# Patient Record
Sex: Female | Born: 1965 | Race: Black or African American | Hispanic: No | Marital: Single | Smoking: Never smoker
Health system: Southern US, Community
[De-identification: ages and names within clinical notes are randomized; demographics above are authoritative.]

---

## 2012-06-09 ENCOUNTER — Observation Stay: Payer: Self-pay | Admitting: Internal Medicine

## 2012-06-09 LAB — FETAL FIBRONECTIN: Fetal Fibronectin: NEGATIVE

## 2012-07-11 ENCOUNTER — Observation Stay: Payer: Self-pay

## 2012-07-13 LAB — BETA STREP CULTURE(ARMC)

## 2012-07-18 ENCOUNTER — Observation Stay: Payer: Self-pay

## 2012-07-24 ENCOUNTER — Observation Stay: Payer: Self-pay | Admitting: Obstetrics and Gynecology

## 2012-07-27 ENCOUNTER — Inpatient Hospital Stay: Payer: Self-pay | Admitting: Obstetrics and Gynecology

## 2012-07-28 LAB — CBC WITH DIFFERENTIAL/PLATELET
Basophil %: 0.3 %
Eosinophil %: 2 %
HCT: 35.1 % (ref 35.0–47.0)
HGB: 12.1 g/dL (ref 12.0–16.0)
Lymphocyte #: 2.2 10*3/uL (ref 1.0–3.6)
Lymphocyte %: 21.6 %
MCV: 89 fL (ref 80–100)
Monocyte #: 1 x10 3/mm — ABNORMAL HIGH (ref 0.2–0.9)
Monocyte %: 9.6 %
Neutrophil #: 6.7 10*3/uL — ABNORMAL HIGH (ref 1.4–6.5)
Neutrophil %: 66.5 %
RBC: 3.94 10*6/uL (ref 3.80–5.20)
RDW: 14.6 % — ABNORMAL HIGH (ref 11.5–14.5)
WBC: 10.1 10*3/uL (ref 3.6–11.0)

## 2012-10-29 ENCOUNTER — Inpatient Hospital Stay (HOSPITAL_COMMUNITY): Admission: AD | Admit: 2012-10-29 | Payer: Self-pay | Source: Ambulatory Visit | Admitting: Obstetrics & Gynecology

## 2013-03-01 ENCOUNTER — Encounter (HOSPITAL_COMMUNITY): Payer: Self-pay | Admitting: Emergency Medicine

## 2013-03-01 ENCOUNTER — Emergency Department (HOSPITAL_COMMUNITY)
Admission: EM | Admit: 2013-03-01 | Discharge: 2013-03-02 | Disposition: A | Discharge status: Home / Self Care | Payer: Self-pay | Attending: Emergency Medicine | Admitting: Emergency Medicine

## 2013-03-01 DIAGNOSIS — L509 Urticaria, unspecified: Secondary | ICD-10-CM | POA: Insufficient documentation

## 2013-03-01 MED ORDER — FAMOTIDINE 20 MG PO TABS
20.0000 mg | ORAL_TABLET | Freq: Once | ORAL | Status: AC
  Administered 2013-03-01: 20 mg via ORAL
  Filled 2013-03-01: qty 1

## 2013-03-01 MED ORDER — DIPHENHYDRAMINE HCL 25 MG PO CAPS
25.0000 mg | ORAL_CAPSULE | Freq: Once | ORAL | Status: AC
  Administered 2013-03-01: 25 mg via ORAL
  Filled 2013-03-01: qty 1

## 2013-03-01 MED ORDER — PREDNISONE 20 MG PO TABS
60.0000 mg | ORAL_TABLET | Freq: Once | ORAL | Status: AC
  Administered 2013-03-01: 60 mg via ORAL
  Filled 2013-03-01: qty 3
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
## 2013-03-01 NOTE — ED Provider Notes (Signed)
   History    CSN: [id number] Arrival date & time 03/01/13  2257  First MD Initiated Contact with Patient 03/01/13 2312     Chief Complaint  Patient presents with  . Urticaria   (Consider location/radiation/quality/duration/timing/severity/associated sxs/prior Treatment) HPI Comments: Developed generalized hives, waxing and waning through out the day.   Has not taken any medications for symptoms. Denies use of any new products, clothing, foods.  No previous episodes of hives    Patient is a 47 y.o. female presenting with urticaria. The history is provided by the patient.  Urticaria This is a new problem. The current episode started today. The problem occurs constantly. The problem has been waxing and waning. Associated symptoms include a rash. Pertinent negatives include no abdominal pain, chest pain, chills, coughing, fever, headaches, nausea or sore throat. Nothing aggravates the symptoms. She has tried nothing for the symptoms.   History reviewed. No pertinent past medical history. History reviewed. No pertinent past surgical history. No family history on file. History  Substance Use Topics  . Smoking status: Never Smoker   . Smokeless tobacco: Not on file  . Alcohol Use: No   OB History   Grav Para Term Preterm Abortions TAB SAB Ect Mult Living                 Review of Systems  Constitutional: Negative for fever and chills.  HENT: Negative for sore throat.   Respiratory: Negative for cough, shortness of breath and wheezing.   Cardiovascular: Negative for chest pain.  Gastrointestinal: Negative for nausea and abdominal pain.  Skin: Positive for rash.  Neurological: Negative for headaches.  All other systems reviewed and are negative.    Allergies  Review of patient's allergies indicates no known allergies.  Home Medications   Current Outpatient Rx  Name  Route  Sig  Dispense  Refill  . diphenhydrAMINE (BENADRYL) 25 mg capsule   Oral   Take 1 capsule (25 mg  total) by mouth every 6 (six) hours as needed for itching.   30 capsule   0   . famotidine (PEPCID) 20 MG tablet   Oral   Take 1 tablet (20 mg total) by mouth 2 (two) times daily.   10 tablet   0   . predniSONE (DELTASONE) 10 MG tablet   Oral   Take 2 tablets (20 mg total) by mouth daily.   15 tablet   0    BP 123/73  Pulse 61  Temp(Src) 97.7 F (36.5 C) (Oral)  Resp 16  SpO2 98%  LMP 02/10/2013 Physical Exam  Nursing note and vitals reviewed. Constitutional: She is oriented to person, place, and time. She appears well-developed and well-nourished.  HENT:  Head: Normocephalic.  Mouth/Throat: Uvula is midline and oropharynx is clear and moist.  Eyes: Pupils are equal, round, and reactive to light.  Neck: Normal range of motion.  Cardiovascular: Normal rate.   Pulmonary/Chest: Effort normal.  Musculoskeletal: Normal range of motion. She exhibits no edema and no tenderness.  Neurological: She is alert and oriented to person, place, and time.  Skin: Skin is warm and dry. Rash noted.    ED Course  Procedures (including critical care time) Labs Reviewed - No data to display No results found. 1. Urticaria     MDM   Will treat with PO prednisode, Benadry and Pepcid Patient has started to get some relief from the medications   Arman Filter, NP 03/02/13 0125

## 2013-03-02 MED ORDER — PREDNISONE 10 MG PO TABS: 20.0000 mg | ORAL_TABLET | Freq: Every day | ORAL | Status: DC

## 2013-03-02 MED ORDER — DIPHENHYDRAMINE HCL 25 MG PO CAPS: 25.0000 mg | ORAL_CAPSULE | Freq: Four times a day (QID) | ORAL | Status: DC | PRN

## 2013-03-02 MED ORDER — FAMOTIDINE 20 MG PO TABS: 20.0000 mg | ORAL_TABLET | Freq: Two times a day (BID) | ORAL | Status: DC

## 2013-03-02 NOTE — ED Provider Notes (Signed)
Medical screening examination/treatment/procedure(s) were performed by non-physician practitioner and as supervising physician I was immediately available for consultation/collaboration.  Sunnie Nielsen, MD 03/02/13 [phone]

## 2013-03-27 ENCOUNTER — Encounter (HOSPITAL_COMMUNITY): Payer: Self-pay | Admitting: Emergency Medicine

## 2013-03-27 ENCOUNTER — Emergency Department (HOSPITAL_COMMUNITY): Payer: Self-pay

## 2013-03-27 ENCOUNTER — Emergency Department (HOSPITAL_COMMUNITY)
Admission: EM | Admit: 2013-03-27 | Discharge: 2013-03-27 | Disposition: A | Discharge status: Home / Self Care | Payer: Self-pay | Attending: Emergency Medicine | Admitting: Emergency Medicine

## 2013-03-27 DIAGNOSIS — R1012 Left upper quadrant pain: Secondary | ICD-10-CM | POA: Insufficient documentation

## 2013-03-27 DIAGNOSIS — R112 Nausea with vomiting, unspecified: Secondary | ICD-10-CM | POA: Insufficient documentation

## 2013-03-27 DIAGNOSIS — R109 Unspecified abdominal pain: Secondary | ICD-10-CM

## 2013-03-27 LAB — CBC WITH DIFFERENTIAL/PLATELET
Basophils Absolute: 0.1 10*3/uL (ref 0.0–0.1)
HCT: 37.6 % (ref 36.0–46.0)
Lymphocytes Relative: 32 % (ref 12–46)
Lymphs Abs: 1.9 10*3/uL (ref 0.7–4.0)
Monocytes Absolute: 0.4 10*3/uL (ref 0.1–1.0)
Neutro Abs: 3.4 10*3/uL (ref 1.7–7.7)
Platelets: 216 10*3/uL (ref 150–400)
RBC: 4.35 MIL/uL (ref 3.87–5.11)
RDW: 12.9 % (ref 11.5–15.5)
WBC: 6 10*3/uL (ref 4.0–10.5)

## 2013-03-27 LAB — COMPREHENSIVE METABOLIC PANEL
ALT: 21 U/L (ref 0–35)
AST: 21 U/L (ref 0–37)
CO2: 26 mEq/L (ref 19–32)
Chloride: 105 mEq/L (ref 96–112)
GFR calc non Af Amer: >90 mL/min (ref >90)
Sodium: 140 mEq/L (ref 135–145)
Total Bilirubin: 0.3 mg/dL (ref 0.3–1.2)

## 2013-03-27 LAB — URINALYSIS, ROUTINE W REFLEX MICROSCOPIC
Bilirubin Urine: NEGATIVE
Glucose, UA: NEGATIVE mg/dL
Hgb urine dipstick: NEGATIVE
Ketones, ur: NEGATIVE mg/dL
Protein, ur: NEGATIVE mg/dL

## 2013-03-27 MED ORDER — ONDANSETRON HCL 4 MG/2ML IJ SOLN
4.0000 mg | Freq: Once | INTRAMUSCULAR | Status: AC
  Administered 2013-03-27: 4 mg via INTRAVENOUS
  Filled 2013-03-27: qty 2

## 2013-03-27 MED ORDER — MORPHINE SULFATE 2 MG/ML IJ SOLN
2.0000 mg | Freq: Once | INTRAMUSCULAR | Status: AC
  Administered 2013-03-27: 2 mg via INTRAVENOUS
  Filled 2013-03-27: qty 1

## 2013-03-27 MED ORDER — ALUM & MAG HYDROXIDE-SIMETH 200-200-20 MG/5 ML NICU TOPICAL: 1.0000 "application " | TOPICAL | Status: AC | PRN

## 2013-03-27 MED ORDER — GI COCKTAIL ~~LOC~~
30.0000 mL | Freq: Once | ORAL | Status: AC
  Administered 2013-03-27: 30 mL via ORAL
  Filled 2013-03-27: qty 30

## 2013-03-27 NOTE — ED Notes (Signed)
Juice given per the pt request

## 2013-03-27 NOTE — ED Notes (Signed)
Pt speaks [place], per interpretor - Pt c/o LUQ abd pain that started back in november, progressively gotten worse the past couple weeks. sts it feels like cramping, pain worsens with palpitation. Reports n/v approx vomited 5 X, denies diarrhea. Pt denies CP/SOB. LMP 04/11/13. Pt reports she used to take pain medication for this issue but doesn't have them anymore.

## 2013-03-27 NOTE — ED Provider Notes (Signed)
History is obtained from medical interpreter patient speaks no English Complains of left-sided abdominal pain onset yesterday. Patient reports he vomited 5 times today last bowel movement yesterday, normal last normal menstrual period was July 25. No urinary symptoms. She treated herself with Advil. She's been having similar pain since November 2013 she was prescribed pain medicine and a high which he does not recall. On exam patient alert nontoxic lungs clear auscultation heart regular rate and rhythm chest minimally tender over lower ribs abdomen minimally tender at left upper quadrant no guarding rigidity or rebound  Doug Sou, MD 03/28/13 0101

## 2013-03-27 NOTE — ED Notes (Signed)
Patient presents to ED with c/o N/V abdominal pain today.

## 2013-03-27 NOTE — ED Provider Notes (Signed)
CSN: [id number]     Arrival date & time 03/27/13  1648 History     First MD Initiated Contact with Patient 03/27/13 1700     Chief Complaint  Patient presents with  . Abdominal Pain   (Consider location/radiation/quality/duration/timing/severity/associated sxs/prior Treatment) HPI 47 year old female with a significant medical history presents with or left upper quadrant pain beginning yesterday. He's reports that her pain began yesterday waking her from sleep and was associated with nausea and vomiting. She's vomited 5-6 times, nonbloody and. Denied any diarrhea, constipation, blood in her stool. Denies hematemesis. Describes a left upper quadrant pain as cramping it comes and goes. Denies dysuria, vaginal bleeding, vaginal discharge. Has been taking Advil without relief.  Additional history from Dr. Ethelda Chick reveals the patient has had similar pain since November of last year and was given a medication in [place].  History reviewed. No pertinent past medical history. History reviewed. No pertinent past surgical history. History reviewed. No pertinent family history. History  Substance Use Topics  . Smoking status: Never Smoker   . Smokeless tobacco: Not on file  . Alcohol Use: No   OB History   Grav Para Term Preterm Abortions TAB SAB Ect Mult Living                 Review of Systems  Constitutional: Negative for fever.  HENT: Negative for sore throat and neck pain.   Eyes: Negative for visual disturbance.  Respiratory: Negative for cough and shortness of breath.   Cardiovascular: Negative for chest pain.  Gastrointestinal: Positive for nausea, vomiting and abdominal pain. Negative for diarrhea and blood in stool.  Genitourinary: Negative for vaginal bleeding, vaginal discharge, difficulty urinating and pelvic pain.  Musculoskeletal: Negative for back pain.  Skin: Negative for rash.  Neurological: Negative for syncope and headaches.    Allergies  Review of patient's  allergies indicates no known allergies.  Home Medications   Current Outpatient Rx  Name  Route  Sig  Dispense  Refill  . ibuprofen (ADVIL,MOTRIN) 200 MG tablet   Oral   Take 400 mg by mouth every 6 (six) hours as needed for pain.         Marland Kitchen alum & mag hydroxide-simeth (MAALOX/MYLANTA) 200-200-20 MG/5 SUSP   Topical   Apply 1 application topically as needed.   150 mL   0    BP 124/83  Pulse 56  Temp(Src) 98 F (36.7 C) (Oral)  Resp 24  SpO2 100%  LMP 03/11/2013 Physical Exam  Nursing note and vitals reviewed. Constitutional: She is oriented to person, place, and time. She appears well-developed and well-nourished. No distress.  HENT:  Head: Normocephalic and atraumatic.  Mouth/Throat: No oropharyngeal exudate.  Eyes: Conjunctivae and EOM are normal.  Neck: Normal range of motion.  Cardiovascular: Normal rate, regular rhythm, normal heart sounds and intact distal pulses.  Exam reveals no gallop and no friction rub.   No murmur heard. Pulmonary/Chest: Effort normal and breath sounds normal. No respiratory distress. She has no wheezes. She has no rales.  Abdominal: Soft. She exhibits no distension. There is tenderness (LUQ). There is no guarding.  Musculoskeletal: She exhibits no edema and no tenderness.  Neurological: She is alert and oriented to person, place, and time.  Skin: Skin is warm and dry. No rash noted. She is not diaphoretic. No erythema.    ED Course   Procedures (including critical care time)  Labs Reviewed  URINE CULTURE  CBC WITH DIFFERENTIAL  COMPREHENSIVE METABOLIC PANEL  LIPASE, BLOOD  URINALYSIS, ROUTINE W REFLEX MICROSCOPIC   Dg Chest 2 View  03/27/2013   *RADIOLOGY REPORT*  Clinical Data: Chest pain, abdominal pain  CHEST - 2 VIEW  Comparison: None.  Findings: Cardiomediastinal silhouette is within normal limits. The lungs are clear. No pleural effusion.  No pneumothorax.  No acute osseous abnormality.  IMPRESSION: Normal chest.   Original  Report Authenticated By: Christiana Pellant, M.D.   1. Abdominal pain     Date: 03/28/2013  Rate: 67  Rhythm: normal sinus rhythm  QRS Axis: normal  Intervals: normal  ST/T Wave abnormalities: normal  Conduction Disutrbances:none  Narrative Interpretation:   Old EKG Reviewed: none available   MDM  47 year old female with a significant medical history presents with or left upper quadrant pain beginning yesterday. Differential diagnosis includes pancreatitis, gastritis, hepatitis, cholecystitis, less likely diverticulitis or pelvic pathology given location of pain.  Urinalysis shows no signs of urinary tract infection. Quality and location of pain not consistent with nephrolithiasis. CBC does not show an inflammatory response to suggest infection, hemoglobin within normal limits. Lipase within normal limits. Chest x-ray performed. Patient with tenderness located over her left lower rib and was within normal limits without signs of fracture. EKG also obtained given location over this area and was within normal limits and have low suspicion for cardiac etiology pain given left upper quadrant tenderness. Patient with likely GERD, PUD or gastritis and was given a GI cocktail with relief of symptoms.  Patient requesting prescription for similar at time of discharge and was given a prescription for Maalox and a number to followup with the wellness care to find a primary care physician for followup.  Patient discharged in stable condition with understanding of reasons to return.   Rhae Lerner, MD 03/28/13 1610  Rhae Lerner, MD 03/28/13 [phone]

## 2013-03-27 NOTE — ED Notes (Signed)
The pt appears comfortable.  She is requesting oral fluids

## 2013-03-29 LAB — URINE CULTURE

## 2013-03-29 NOTE — ED Provider Notes (Signed)
I have personally seen and examined the patient.  I have discussed the plan of care with the resident.  I have reviewed the documentation on PMH/FH/Soc. History.  I have reviewed the documentation of the resident and agree.  Doug Sou, MD 03/29/13 [phone]

## 2013-12-26 ENCOUNTER — Ambulatory Visit: Payer: Self-pay

## 2014-01-25 ENCOUNTER — Ambulatory Visit: Payer: Self-pay
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
## 2015-06-15 LAB — GLUCOSE, POCT (MANUAL RESULT ENTRY): POC GLUCOSE: 69 mg/dL — AB (ref 70–99)

## 2015-06-20 ENCOUNTER — Ambulatory Visit: Payer: Self-pay | Attending: Family Medicine

## 2016-07-25 LAB — GLUCOSE, POCT (MANUAL RESULT ENTRY): POC GLUCOSE: 83 mg/dL (ref 70–99)

## 2016-08-17 ENCOUNTER — Emergency Department (HOSPITAL_COMMUNITY)
Admission: EM | Admit: 2016-08-17 | Discharge: 2016-08-17 | Disposition: A | Discharge status: Home / Self Care | Payer: Self-pay | Attending: Emergency Medicine | Admitting: Emergency Medicine

## 2016-08-17 ENCOUNTER — Emergency Department (HOSPITAL_COMMUNITY): Payer: Self-pay

## 2016-08-17 ENCOUNTER — Encounter (HOSPITAL_COMMUNITY): Payer: Self-pay | Admitting: Emergency Medicine

## 2016-08-17 DIAGNOSIS — Z79899 Other long term (current) drug therapy: Secondary | ICD-10-CM | POA: Insufficient documentation

## 2016-08-17 DIAGNOSIS — Y929 Unspecified place or not applicable: Secondary | ICD-10-CM | POA: Insufficient documentation

## 2016-08-17 DIAGNOSIS — T148XXA Other injury of unspecified body region, initial encounter: Secondary | ICD-10-CM

## 2016-08-17 DIAGNOSIS — S39012A Strain of muscle, fascia and tendon of lower back, initial encounter: Secondary | ICD-10-CM | POA: Insufficient documentation

## 2016-08-17 DIAGNOSIS — Y939 Activity, unspecified: Secondary | ICD-10-CM | POA: Insufficient documentation

## 2016-08-17 DIAGNOSIS — Y999 Unspecified external cause status: Secondary | ICD-10-CM | POA: Insufficient documentation

## 2016-08-17 DIAGNOSIS — X58XXXA Exposure to other specified factors, initial encounter: Secondary | ICD-10-CM | POA: Insufficient documentation

## 2016-08-17 LAB — URINALYSIS, ROUTINE W REFLEX MICROSCOPIC
BILIRUBIN URINE: NEGATIVE
GLUCOSE, UA: NEGATIVE mg/dL
Hgb urine dipstick: NEGATIVE
KETONES UR: NEGATIVE mg/dL
LEUKOCYTES UA: NEGATIVE
Nitrite: NEGATIVE
Protein, ur: NEGATIVE mg/dL
Specific Gravity, Urine: 1.005 (ref 1.005–1.030)
pH: 7 (ref 5.0–8.0)

## 2016-08-17 MED ORDER — ACETAMINOPHEN 500 MG PO TABS
1000.0000 mg | ORAL_TABLET | Freq: Once | ORAL | Status: AC
  Administered 2016-08-17: 1000 mg via ORAL
  Filled 2016-08-17: qty 2

## 2016-08-17 MED ORDER — IBUPROFEN 800 MG PO TABS
800.0000 mg | ORAL_TABLET | Freq: Once | ORAL | Status: AC
  Administered 2016-08-17: 800 mg via ORAL
  Filled 2016-08-17: qty 1

## 2016-08-17 NOTE — ED Provider Notes (Signed)
[facility]-EMERGENCY DEPT Provider Note   CSN: [id number] Arrival date & time: 08/17/16  0359     History   Chief Complaint Chief Complaint  Patient presents with  . Back Pain    HPI Stephania FragminHadiza Whitner is a 50 y.o. female.  50 yo F with a chief complaint of left-sided back pain. She describes this as a pain that starts in her back and radiates around to the front. Describes this as a severe pain worse with certain positions and palpation. Denies worse with movement or twisting. Denies dysuria denies fevers denies vomiting. Denies abdominal pain. Denies injury.   The history is provided by the patient.  Back Pain   This is a new problem. The current episode started less than 1 hour ago. The problem occurs constantly. The problem has not changed since onset.The pain is associated with no known injury. The pain is present in the thoracic spine. The quality of the pain is described as shooting. Radiates to: around the left rib margin to the front. The pain is at a severity of 8/10. The pain is moderate. Exacerbated by: palpation. Pertinent negatives include no chest pain, no fever, no headaches and no dysuria. She has tried nothing for the symptoms. The treatment provided no relief.    History reviewed. No pertinent past medical history.  There are no active problems to display for this patient.   History reviewed. No pertinent surgical history.  OB History    No data available       Home Medications    Prior to Admission medications   Medication Sig Start Date End Date Taking? Authorizing Provider  alum & mag hydroxide-simeth (MAALOX/MYLANTA) 200-200-20 MG/5 SUSP Apply 1 application topically as needed. 03/27/13   Alvira MondayErin Schlossman, MD  ibuprofen (ADVIL,MOTRIN) 200 MG tablet Take 400 mg by mouth every 6 (six) hours as needed for pain.    Historical Provider, MD    Family History No family history on file.  Social History Social History  Substance Use Topics  . Smoking status:  Never Smoker  . Smokeless tobacco: Never Used  . Alcohol use No     Allergies   Patient has no known allergies.   Review of Systems Review of Systems  Constitutional: Negative for chills and fever.  HENT: Negative for congestion and rhinorrhea.   Eyes: Negative for redness and visual disturbance.  Respiratory: Negative for shortness of breath and wheezing.   Cardiovascular: Negative for chest pain and palpitations.  Gastrointestinal: Negative for nausea and vomiting.  Genitourinary: Negative for dysuria and urgency.  Musculoskeletal: Positive for back pain. Negative for arthralgias and myalgias.  Skin: Negative for pallor and wound.  Neurological: Negative for dizziness and headaches.     Physical Exam Updated Vital Signs BP 117/83 (BP Location: Left Arm)   Pulse 62   Temp 97.8 F (36.6 C) (Oral)   Resp 14   Ht 5\' 2"  (1.575 m)   Wt 167 lb 9 oz (76 kg)   LMP 07/09/2016 (Approximate)   SpO2 100%   BMI 30.65 kg/m   Physical Exam  Constitutional: She is oriented to person, place, and time. She appears well-developed and well-nourished. No distress.  HENT:  Head: Normocephalic and atraumatic.  Eyes: EOM are normal. Pupils are equal, round, and reactive to light.  Neck: Normal range of motion. Neck supple.  Cardiovascular: Normal rate and regular rhythm.  Exam reveals no gallop and no friction rub.   No murmur heard. Pulmonary/Chest: Effort normal. She has no  wheezes. She has no rales.  Abdominal: Soft. She exhibits no distension and no mass. There is no tenderness. There is no guarding.  Musculoskeletal: She exhibits tenderness (TTP about the L CVA). She exhibits no edema.  Neurological: She is alert and oriented to person, place, and time.  Skin: Skin is warm and dry. She is not diaphoretic.  Psychiatric: She has a normal mood and affect. Her behavior is normal.  Nursing note and vitals reviewed.    ED Treatments / Results  Labs (all labs ordered are listed, but  only abnormal results are displayed) Labs Reviewed  URINALYSIS, ROUTINE W REFLEX MICROSCOPIC    EKG  EKG Interpretation None       Radiology No results found.  Procedures Procedures (including critical care time)  Medications Ordered in ED Medications  acetaminophen (TYLENOL) tablet 1,000 mg (not administered)  ibuprofen (ADVIL,MOTRIN) tablet 800 mg (not administered)     Initial Impression / Assessment and Plan / ED Course  I have reviewed the triage vital signs and the nursing notes.  Pertinent labs & imaging results that were available during my care of the patient were reviewed by me and considered in my medical decision making (see chart for details).  Clinical Course     50 yo F With a chief complaints of left-sided thoracic back pain. She described this in a pattern that radiates into the friend I do not see a rash though in the distribution she describes shingles should be in the differential. 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 Will obtain a UA.  CXR negative. UA negative, d/c home.     I have discussed the diagnosis/risks/treatment options with the patient and family and believe the pt to be eligible for discharge home to follow-up with PCP. We also discussed returning to the ED immediately if new or worsening sx occur. We discussed the sx which are most concerning (e.g., sudden worsening pain, fever, inability to tolerate by mouth) that necessitate immediate return. Medications administered to the patient during their visit and any new prescriptions provided to the patient are listed below.  Medications given during this visit Medications  acetaminophen (TYLENOL) tablet 1,000 mg (1,000 mg Oral Given 08/17/16 0543)  ibuprofen (ADVIL,MOTRIN) tablet 800 mg (800 mg Oral Given 08/17/16 0543)     The patient appears reasonably screen and/or stabilized for discharge and I doubt any other medical condition or other [facility] requiring further screening,  evaluation, or treatment in the ED at this time prior to discharge.    Final Clinical Impressions(s) / ED Diagnoses   Final diagnoses:  None    New Prescriptions New Prescriptions   No medications on file     Melene PlanDan Iseah Plouff, DO 08/17/16 2325

## 2016-08-17 NOTE — ED Notes (Signed)
Patient transported to X-ray 

## 2016-08-17 NOTE — Discharge Instructions (Signed)
Take 4 over the counter ibuprofen tablets 3 times a day or 2 over-the-counter naproxen tablets twice a day for pain. Also take tylenol 1000mg(2 extra strength) four times a day.    

## 2016-08-17 NOTE — ED Triage Notes (Signed)
Patient reports mid back pain radiating to left lateral ribcage worse with movement and changing positions onset 1 week  , denies injury or fall / respirations unlabored .

## 2016-09-30 ENCOUNTER — Ambulatory Visit: Payer: Self-pay | Attending: Family Medicine | Admitting: Family Medicine

## 2016-09-30 ENCOUNTER — Encounter: Payer: Self-pay | Admitting: Family Medicine

## 2016-09-30 VITALS — BP 119/72 | HR 68 | Temp 98.2°F | Resp 18 | Ht 65.0 in | Wt 163.2 lb

## 2016-09-30 DIAGNOSIS — Z0000 Encounter for general adult medical examination without abnormal findings: Secondary | ICD-10-CM

## 2016-09-30 DIAGNOSIS — Z0001 Encounter for general adult medical examination with abnormal findings: Secondary | ICD-10-CM | POA: Insufficient documentation

## 2016-09-30 DIAGNOSIS — Z23 Encounter for immunization: Secondary | ICD-10-CM

## 2016-09-30 DIAGNOSIS — N926 Irregular menstruation, unspecified: Secondary | ICD-10-CM | POA: Insufficient documentation

## 2016-09-30 DIAGNOSIS — H11002 Unspecified pterygium of left eye: Secondary | ICD-10-CM | POA: Insufficient documentation

## 2016-09-30 LAB — BASIC METABOLIC PANEL WITH GFR
BUN: 9 mg/dL (ref 7–25)
CO2: 27 mmol/L (ref 20–31)
CREATININE: 0.84 mg/dL (ref 0.50–1.10)
Calcium: 9.3 mg/dL (ref 8.6–10.2)
Chloride: 105 mmol/L (ref 98–110)
GFR, EST NON AFRICAN AMERICAN: 82 mL/min (ref >=60)
GFR, Est African American: >89 mL/min (ref >=60)
Glucose, Bld: 82 mg/dL (ref 65–99)
Potassium: 4 mmol/L (ref 3.5–5.3)
SODIUM: 141 mmol/L (ref 135–146)

## 2016-09-30 LAB — LIPID PANEL
Cholesterol: 202 mg/dL — ABNORMAL HIGH (ref <200)
HDL: 73 mg/dL (ref >50)
LDL Cholesterol: 115 mg/dL — ABNORMAL HIGH (ref <100)
Total CHOL/HDL Ratio: 2.8 Ratio (ref <5.0)
Triglycerides: 72 mg/dL (ref <150)
VLDL: 14 mg/dL (ref <30)

## 2016-09-30 LAB — CBC WITH DIFFERENTIAL/PLATELET
Basophils Absolute: 41 cells/uL (ref 0–200)
Basophils Relative: 1 %
Eosinophils Absolute: 164 cells/uL (ref 15–500)
Eosinophils Relative: 4 %
HEMATOCRIT: 36.2 % (ref 35.0–45.0)
Hemoglobin: 12.1 g/dL (ref 11.7–15.5)
LYMPHS PCT: 36 %
Lymphs Abs: 1476 cells/uL (ref 850–3900)
MCH: 29.6 pg (ref 27.0–33.0)
MCHC: 33.4 g/dL (ref 32.0–36.0)
MCV: 88.5 fL (ref 80.0–100.0)
MONO ABS: 410 {cells}/uL (ref 200–950)
MONOS PCT: 10 %
MPV: 10.3 fL (ref 7.5–12.5)
NEUTROS ABS: 2009 {cells}/uL (ref 1500–7800)
Neutrophils Relative %: 49 %
PLATELETS: 197 10*3/uL (ref 140–400)
RBC: 4.09 MIL/uL (ref 3.80–5.10)
RDW: 14.3 % (ref 11.0–15.0)
WBC: 4.1 10*3/uL (ref 3.8–10.8)

## 2016-09-30 LAB — POCT URINE PREGNANCY: PREG TEST UR: NEGATIVE

## 2016-09-30 LAB — HEPATIC FUNCTION PANEL
ALT: 17 U/L (ref 6–29)
AST: 22 U/L (ref 10–35)
Albumin: 4.2 g/dL (ref 3.6–5.1)
Alkaline Phosphatase: 80 U/L (ref 33–115)
BILIRUBIN DIRECT: 0.1 mg/dL (ref <=0.2)
BILIRUBIN TOTAL: 0.4 mg/dL (ref 0.2–1.2)
Indirect Bilirubin: 0.3 mg/dL (ref 0.2–1.2)
Total Protein: 7.7 g/dL (ref 6.1–8.1)

## 2016-09-30 NOTE — Progress Notes (Signed)
Patient is here for a physical  Patient has not eaten today   Patient has not taking any meds today  Patient denies pain today

## 2016-09-30 NOTE — Patient Instructions (Signed)
Menopause Menopause is the normal time of life when menstrual periods stop completely. Menopause is complete when you have missed 12 consecutive menstrual periods. It usually occurs between the ages of 48 years and 55 years. Very rarely does a woman develop menopause before the age of 40 years. At menopause, your ovaries stop producing the female hormones estrogen and progesterone. This can cause undesirable symptoms and also affect your health. Sometimes the symptoms may occur 4-5 years before the menopause begins. There is no relationship between menopause and:  Oral contraceptives.  Number of children you had.  Race.  The age your menstrual periods started (menarche).  Heavy smokers and very thin women may develop menopause earlier in life. What are the causes?  The ovaries stop producing the female hormones estrogen and progesterone. Other causes include:  Surgery to remove both ovaries.  The ovaries stop functioning for no known reason.  Tumors of the pituitary gland in the brain.  Medical disease that affects the ovaries and hormone production.  Radiation treatment to the abdomen or pelvis.  Chemotherapy that affects the ovaries.  What are the signs or symptoms?  Hot flashes.  Night sweats.  Decrease in sex drive.  Vaginal dryness and thinning of the vagina causing painful intercourse.  Dryness of the skin and developing wrinkles.  Headaches.  Tiredness.  Irritability.  Memory problems.  Weight gain.  Bladder infections.  Hair growth of the face and chest.  Infertility. More serious symptoms include:  Loss of bone (osteoporosis) causing breaks (fractures).  Depression.  Hardening and narrowing of the arteries (atherosclerosis) causing heart attacks and strokes.  How is this diagnosed?  When the menstrual periods have stopped for 12 straight months.  Physical exam.  Hormone studies of the blood. How is this treated? There are many treatment  choices and nearly as many questions about them. The decisions to treat or not to treat menopausal changes is an individual choice made with your health care provider. Your health care provider can discuss the treatments with you. Together, you can decide which treatment will work best for you. Your treatment choices may include:  Hormone therapy (estrogen and progesterone).  Non-hormonal medicines.  Treating the individual symptoms with medicine (for example antidepressants for depression).  Herbal medicines that may help specific symptoms.  Counseling by a psychiatrist or psychologist.  Group therapy.  Lifestyle changes including: ? Eating healthy. ? Regular exercise. ? Limiting caffeine and alcohol. ? Stress management and meditation.  No treatment.  Follow these instructions at home:  Take the medicine your health care provider gives you as directed.  Get plenty of sleep and rest.  Exercise regularly.  Eat a diet that contains calcium (good for the bones) and soy products (acts like estrogen hormone).  Avoid alcoholic beverages.  Do not smoke.  If you have hot flashes, dress in layers.  Take supplements, calcium, and vitamin D to strengthen bones.  You can use over-the-counter lubricants or moisturizers for vaginal dryness.  Group therapy is sometimes very helpful.  Acupuncture may be helpful in some cases. Contact a health care provider if:  You are not sure you are in menopause.  You are having menopausal symptoms and need advice and treatment.  You are still having menstrual periods after age 55 years.  You have pain with intercourse.  Menopause is complete (no menstrual period for 12 months) and you develop vaginal bleeding.  You need a referral to a specialist (gynecologist, psychiatrist, or psychologist) for treatment. Get help right   away if:  You have severe depression.  You have excessive vaginal bleeding.  You fell and think you have a  broken bone.  You have pain when you urinate.  You develop leg or chest pain.  You have a fast pounding heart beat (palpitations).  You have severe headaches.  You develop vision problems.  You feel a lump in your breast.  You have abdominal pain or severe indigestion. This information is not intended to replace advice given to you by your health care provider. Make sure you discuss any questions you have with your health care provider. Document Released: 10/25/2003 Document Revised: 01/10/2016 Document Reviewed: 03/03/2013 Elsevier Interactive Patient Education  2017 Elsevier Inc[employer]  

## 2016-09-30 NOTE — Progress Notes (Signed)
Subjective:   Patient ID: Selena Conley, female    DOB: 26-Aug-1965, 51 y.o.   MRN: [id number]  Chief Complaint  Patient presents with  . Establish Care   HPI  Selena Conley 51 y.o. female presents For annual physical exam. She is a nonsmoker. She denies any family history of hypertension, diabetes, or cancer. She does have concerns about a miss menstrual period. Reports last menstrual period was in December 2017. Denies symptoms of all other pertinent systems.   No past medical history on file.  No past surgical history on file.  No family history on file.  Social History   Social History  . Marital status: Single    Spouse name: N/A  . Number of children: N/A  . Years of education: N/A   Occupational History  . Not on file.   Social History Main Topics  . Smoking status: Never Smoker  . Smokeless tobacco: Never Used  . Alcohol use No  . Drug use: No  . Sexual activity: Not on file   Other Topics Concern  . Not on file   Social History Narrative  . No narrative on file    Outpatient Medications Prior to Visit  Medication Sig Dispense Refill  . alum & mag hydroxide-simeth (MAALOX/MYLANTA) 200-200-20 MG/5 SUSP Apply 1 application topically as needed. (Patient not taking: Reported on 08/17/2016) 150 mL 0   No facility-administered medications prior to visit.     No Known Allergies  Review of Systems  Constitutional: Negative.   HENT: Negative.   Eyes: Negative.   Respiratory: Negative.   Cardiovascular: Negative.   Gastrointestinal: Negative.   Genitourinary:       Menstrual problem.  Musculoskeletal: Negative.   Skin: Negative.   Neurological: Negative.   Endo/Heme/Allergies: Negative.   Psychiatric/Behavioral: Negative.        Objective:    Physical Exam  Constitutional: She is oriented to person, place, and time. She appears well-developed and well-nourished.  HENT:  Head: Normocephalic and atraumatic.  Right Ear: External ear normal.  Left  Ear: External ear normal.  Nose: Nose normal.  Mouth/Throat: Oropharynx is clear and moist.  Eyes: Conjunctivae and EOM are normal. Pupils are equal, round, and reactive to light.    Neck: Normal range of motion. Neck supple.  Cardiovascular: Normal rate, regular rhythm, normal heart sounds and intact distal pulses.   Pulmonary/Chest: Effort normal and breath sounds normal.  Abdominal: Soft. Bowel sounds are normal.  Genitourinary: Vagina normal and uterus normal.  Musculoskeletal: Normal range of motion.  Neurological: She is alert and oriented to person, place, and time. She has normal reflexes.  Skin: Skin is warm and dry.  Psychiatric: She has a normal mood and affect. Her behavior is normal. Thought content normal.  Nursing note and vitals reviewed.   BP 119/72 (BP Location: Left Arm, Patient Position: Sitting, Cuff Size: Normal)   Pulse 68   Temp 98.2 F (36.8 C) (Oral)   Resp 18   Ht 5\' 5"  (1.651 m)   Wt 163 lb 3.2 oz (74 kg)   LMP 07/07/2016   SpO2 94%   BMI 27.16 kg/m  Wt Readings from Last 3 Encounters:  09/30/16 163 lb 3.2 oz (74 kg)  08/17/16 167 lb 9 oz (76 kg)  06/15/15 166 lb (75.3 kg)    Immunization History  Administered Date(s) Administered  . Influenza,inj,Quad PF,36+ Mos 09/30/2016  . Tdap 09/30/2016    Diabetic Foot Exam - Simple   No data filed  Lab Results  Component Value Date   TSH 0.84 09/30/2016   Lab Results  Component Value Date   WBC 4.1 09/30/2016   HGB 12.1 09/30/2016   HCT 36.2 09/30/2016   MCV 88.5 09/30/2016   PLT 197 09/30/2016   Lab Results  Component Value Date   NA 141 09/30/2016   K 4.0 09/30/2016   CO2 27 09/30/2016   GLUCOSE 82 09/30/2016   BUN 9 09/30/2016   CREATININE 0.84 09/30/2016   BILITOT 0.4 09/30/2016   ALKPHOS 80 09/30/2016   AST 22 09/30/2016   ALT 17 09/30/2016   PROT 7.7 09/30/2016   ALBUMIN 4.2 09/30/2016   CALCIUM 9.3 09/30/2016   Lab Results  Component Value Date   CHOL 202 (H)  09/30/2016   Lab Results  Component Value Date   HDL 73 09/30/2016   Lab Results  Component Value Date   LDLCALC 115 (H) 09/30/2016   Lab Results  Component Value Date   TRIG 72 09/30/2016   Lab Results  Component Value Date   CHOLHDL 2.8 09/30/2016   Lab Results  Component Value Date   HGBA1C 5.8 (H) 09/30/2016       Assessment & Plan:   Problem List Items Addressed This Visit    None    Visit Diagnoses    Annual physical exam    -  Primary   Relevant Orders   Ambulatory referral to Dentistry   HIV antibody (Completed)   BASIC METABOLIC PANEL WITH GFR (Completed)   CBC with Differential (Completed)   Vitamin D, 25-hydroxy (Completed)   TSH (Completed)   Hemoglobin A1c (Completed)   Lipid Panel (Completed)   Hepatic Function Panel (Completed)   Irregular menstrual cycle       Relevant Orders   POCT urine pregnancy (Completed)   FSH/LH (Completed)   Healthcare maintenance       Relevant Orders   MM Digital Screening   Pterygium of left eye       Relevant Orders   Ambulatory referral to Ophthalmology   Needs flu shot       Relevant Orders   Flu Vaccine QUAD 36+ mos PF IM (Fluarix & Fluzone Quad PF) (Completed)        Selena SenateMandesia Hairston, FNP

## 2016-10-01 LAB — FSH/LH
FSH: 138.9 m[IU]/mL — ABNORMAL HIGH
LH: 57 m[IU]/mL

## 2016-10-01 LAB — HEMOGLOBIN A1C
Hgb A1c MFr Bld: 5.8 % — ABNORMAL HIGH (ref <5.7)
Mean Plasma Glucose: 120 mg/dL

## 2016-10-01 LAB — VITAMIN D 25 HYDROXY (VIT D DEFICIENCY, FRACTURES): Vit D, 25-Hydroxy: 19 ng/mL — ABNORMAL LOW (ref 30–100)

## 2016-10-01 LAB — HIV ANTIBODY (ROUTINE TESTING W REFLEX): HIV: NONREACTIVE

## 2016-10-01 LAB — TSH: TSH: 0.84 m[IU]/L

## 2016-10-07 ENCOUNTER — Other Ambulatory Visit: Payer: Self-pay | Admitting: Family Medicine

## 2016-10-07 DIAGNOSIS — R7303 Prediabetes: Secondary | ICD-10-CM

## 2016-10-07 DIAGNOSIS — E559 Vitamin D deficiency, unspecified: Secondary | ICD-10-CM

## 2016-10-07 MED ORDER — VITAMIN D (ERGOCALCIFEROL) 1.25 MG (50000 UNIT) PO CAPS: 50000.0000 [IU] | ORAL_CAPSULE | ORAL | 0 refills | Status: AC

## 2016-10-09 ENCOUNTER — Telehealth: Payer: Self-pay

## 2016-10-09 NOTE — Telephone Encounter (Signed)
-----   Message from Lizbeth BarkMandesia R Hairston, FNP sent at 10/07/2016  6:36 PM EST ----- HIV negative Vitamin D level was low. Vitamin D helps to keep bones strong. You were prescribed ergocalciferol (capsules) to increase your vitamin-d level. Once finished start taking OTC vitamin d supplement with 800 international units (I) of vitamin-d per day.  HgbA1c is  5.8 which is considered pre-diabetes. HgbA1c gives an average of your blood sugar levels over the last 3 months. HgbA1c levels of 6.5 or greater indicates diabetes. Avoid eating large amounts of starches, white bread, rice, and sugar. Start exercising 3 to 5 time per week. Schedule lab appointment to recheck HgbA1c levels in 3 months. FSH/LH look at your ovary function and ovulation. You levels indicate you are perimenopausal. No menstrual cycle for 12 consecutive months indicates menopause. Kidney function normal Liver function normal Labs normal

## 2016-10-14 NOTE — Telephone Encounter (Signed)
-----   Message from Mandesia R Hairston, FNP sent at 10/07/2016  6:36 PM EST ----- HIV negative Vitamin D level was low. Vitamin D helps to keep bones strong. You were prescribed ergocalciferol (capsules) to increase your vitamin-d level. Once finished start taking OTC vitamin d supplement with 800 international units (I) of vitamin-d per day.  HgbA1c is  5.8 which is considered pre-diabetes. HgbA1c gives an average of your blood sugar levels over the last 3 months. HgbA1c levels of 6.5 or greater indicates diabetes. Avoid eating large amounts of starches, white bread, rice, and sugar. Start exercising 3 to 5 time per week. Schedule lab appointment to recheck HgbA1c levels in 3 months. FSH/LH look at your ovary function and ovulation. You levels indicate you are perimenopausal. No menstrual cycle for 12 consecutive months indicates menopause. Kidney function normal Liver function normal Labs normal  

## 2016-10-14 NOTE — Telephone Encounter (Signed)
Interpreter name Silvestre Mesicristina  Interpreter ID #  [id number]  CMA call to go over lab results   Patient did not answer, interpreter left a VM stating the reason of the call

## 2018-01-26 ENCOUNTER — Emergency Department (HOSPITAL_COMMUNITY)
Admission: EM | Admit: 2018-01-26 | Discharge: 2018-01-26 | Disposition: A | Discharge status: Home / Self Care | Payer: Self-pay | Attending: Emergency Medicine | Admitting: Emergency Medicine

## 2018-01-26 DIAGNOSIS — K029 Dental caries, unspecified: Secondary | ICD-10-CM | POA: Insufficient documentation

## 2018-01-26 DIAGNOSIS — K0889 Other specified disorders of teeth and supporting structures: Secondary | ICD-10-CM

## 2018-01-26 MED ORDER — PENICILLIN V POTASSIUM 500 MG PO TABS: 500.0000 mg | ORAL_TABLET | Freq: Four times a day (QID) | ORAL | 0 refills | Status: AC

## 2018-01-26 MED ORDER — OXYCODONE-ACETAMINOPHEN 5-325 MG PO TABS
1.0000 | ORAL_TABLET | Freq: Once | ORAL | Status: AC
  Administered 2018-01-26: 1 via ORAL
  Filled 2018-01-26: qty 1

## 2018-01-26 MED ORDER — TRAMADOL HCL 50 MG PO TABS: 50.0000 mg | ORAL_TABLET | Freq: Four times a day (QID) | ORAL | 0 refills | Status: AC | PRN

## 2018-01-26 NOTE — ED Provider Notes (Signed)
[facility] EMERGENCY DEPARTMENT Provider Note   CSN: [id number] Arrival date & time: 01/26/18  2010     History   Chief Complaint Chief Complaint  Patient presents with  . Dental Pain    HPI Dynasti Kerman is a 52 y.o. female who presents to ED for evaluation of 2-week history of front upper dental pain.  She has not seen a dentist since last year.  States that the pain began suddenly.  Only mild improvement with Tylenol reported.  Denies any drainage, trouble breathing or trouble swallowing, injury to the area, neck pain, fever.  HPI  No past medical history on file.  There are no active problems to display for this patient.   No past surgical history on file.   OB History   None      Home Medications    Prior to Admission medications   Medication Sig Start Date End Date Taking? Authorizing Provider  alum & mag hydroxide-simeth (MAALOX/MYLANTA) 200-200-20 MG/5 SUSP Apply 1 application topically as needed. Patient not taking: Reported on 08/17/2016 03/27/13   Alvira Monday, MD  penicillin v potassium (VEETID) 500 MG tablet Take 1 tablet (500 mg total) by mouth 4 (four) times daily for 7 days. 01/26/18 02/02/18  Azam Gervasi, PA-C  traMADol (ULTRAM) 50 MG tablet Take 1 tablet (50 mg total) by mouth every 6 (six) hours as needed. 01/26/18   Dietrich Pates, PA-C    Family History No family history on file.  Social History Social History   Tobacco Use  . Smoking status: Never Smoker  . Smokeless tobacco: Never Used  Substance Use Topics  . Alcohol use: No  . Drug use: No     Allergies   Patient has no known allergies.   Review of Systems Review of Systems  Constitutional: Negative for chills and fever.  HENT: Positive for dental problem. Negative for facial swelling, sinus pressure and sinus pain.   Respiratory: Negative for shortness of breath.   Gastrointestinal: Negative for nausea and vomiting.  Musculoskeletal: Negative for neck  pain.     Physical Exam Updated Vital Signs BP 115/79 (BP Location: Left Arm)   Pulse 66   Temp 99.5 F (37.5 C) (Oral)   Resp 16   Ht 5\' 2"  (1.575 m)   SpO2 100%   BMI 29.85 kg/m   Physical Exam  Constitutional: She appears well-developed and well-nourished. No distress.  HENT:  Head: Normocephalic and atraumatic.  Mouth/Throat: Uvula is midline. She does not have dentures. Abnormal dentition. Dental caries present. No uvula swelling.    Tenderness to palpation of the indicated teeth. No facial, neck or cheek swelling noted. No pooling of secretions or trismus.  Normal voice noted with no difficulty swallowing or breathing.  No gross dental abscess noted.  No submandibular erythema, crepitus or edema noted.  Eyes: Conjunctivae and EOM are normal. No scleral icterus.  Neck: Normal range of motion.  Pulmonary/Chest: Effort normal. No respiratory distress.  Neurological: She is alert.  Skin: No rash noted. She is not diaphoretic.  Psychiatric: She has a normal mood and affect.  Nursing note and vitals reviewed.    ED Treatments / Results  Labs (all labs ordered are listed, but only abnormal results are displayed) Labs Reviewed - No data to display  EKG None  Radiology No results found.  Procedures Procedures (including critical care time)  Medications Ordered in ED Medications  oxyCODONE-acetaminophen (PERCOCET/ROXICET) 5-325 MG per tablet 1 tablet (has no administration in  time range)     Initial Impression / Assessment and Plan / ED Course  I have reviewed the triage vital signs and the nursing notes.  Pertinent labs & imaging results that were available during my care of the patient were reviewed by me and considered in my medical decision making (see chart for details).     Patient with dentalgia. On exam, there is no evidence of a drainable abscess. No trismus, glossal elevation, unilateral tonsillar swelling. No evidence of retropharyngeal or  peritonsillar abscess or Ludwig angina. Will treat with  short course of pain medication, penicillin. Pt instructed to follow-up with dentist as soon as possible.  [place] PMP reviewed with no discrepancies.  Portions of this note were generated with Scientist, clinical (histocompatibility and immunogenetics)Dragon dictation software. Dictation errors may occur despite best attempts at proofreading.   Final Clinical Impressions(s) / ED Diagnoses   Final diagnoses:  Pain, dental    ED Discharge Orders        Ordered    traMADol (ULTRAM) 50 MG tablet  Every 6 hours PRN     01/26/18 2143    penicillin v potassium (VEETID) 500 MG tablet  4 times daily     01/26/18 2143       Dietrich PatesKhatri, Aunesti Pellegrino, PA-C 01/26/18 2145    Mancel BaleWentz, Elliott, MD 01/27/18 1016

## 2018-01-26 NOTE — ED Provider Notes (Signed)
Patient placed in Quick Look pathway, seen and evaluated   Chief Complaint: Dental pain HPI:   Dental pain that is ongoing, in front teeth.  Has not been to dentist.  Last night hurt so much she could not sleep.  Tried tylenol last night. Mild relief  ROS: No fevers  Physical Exam:   Gen: No distress  Neuro: Awake and Alert  Skin: Warm    Focused Exam: Anterior two front teeth are loose.     Initiation of care has begun. The patient has been counseled on the process, plan, and necessity for staying for the completion/evaluation, and the remainder of the medical screening examination    Norman ClayHammond, Elizabeth W, PA-C 01/26/18 2027    Rolan BuccoBelfi, Melanie, MD 01/26/18 2032

## 2018-01-26 NOTE — ED Triage Notes (Signed)
Pt reports dental pain for two weeks that comes and goes however it was so bad last night she could not sleep.  Pt has loose front upper tooth.

## 2022-01-17 ENCOUNTER — Other Ambulatory Visit: Payer: Self-pay

## 2022-01-17 ENCOUNTER — Encounter (HOSPITAL_COMMUNITY): Payer: Self-pay

## 2022-01-17 ENCOUNTER — Emergency Department (HOSPITAL_COMMUNITY): Payer: Self-pay

## 2022-01-17 ENCOUNTER — Emergency Department (HOSPITAL_COMMUNITY)
Admission: EM | Admit: 2022-01-17 | Discharge: 2022-01-17 | Disposition: A | Discharge status: Home / Self Care | Payer: Self-pay | Attending: Emergency Medicine | Admitting: Emergency Medicine

## 2022-01-17 DIAGNOSIS — Z23 Encounter for immunization: Secondary | ICD-10-CM | POA: Insufficient documentation

## 2022-01-17 DIAGNOSIS — W2203XA Walked into furniture, initial encounter: Secondary | ICD-10-CM | POA: Insufficient documentation

## 2022-01-17 DIAGNOSIS — S91119A Laceration without foreign body of unspecified toe without damage to nail, initial encounter: Secondary | ICD-10-CM

## 2022-01-17 DIAGNOSIS — S9032XA Contusion of left foot, initial encounter: Secondary | ICD-10-CM | POA: Insufficient documentation

## 2022-01-17 DIAGNOSIS — S91115A Laceration without foreign body of left lesser toe(s) without damage to nail, initial encounter: Secondary | ICD-10-CM | POA: Insufficient documentation

## 2022-01-17 DIAGNOSIS — M79675 Pain in left toe(s): Secondary | ICD-10-CM

## 2022-01-17 MED ORDER — ACETAMINOPHEN 325 MG PO TABS
650.0000 mg | ORAL_TABLET | Freq: Once | ORAL | Status: AC
  Administered 2022-01-17: 650 mg via ORAL
  Filled 2022-01-17: qty 2

## 2022-01-17 MED ORDER — TETANUS-DIPHTH-ACELL PERTUSSIS 5-2.5-18.5 LF-MCG/0.5 IM SUSY
0.5000 mL | PREFILLED_SYRINGE | Freq: Once | INTRAMUSCULAR | Status: AC
  Administered 2022-01-17: 0.5 mL via INTRAMUSCULAR
  Filled 2022-01-17: qty 0.5

## 2022-01-17 MED ORDER — BACITRACIN ZINC 500 UNIT/GM EX OINT
TOPICAL_OINTMENT | Freq: Once | CUTANEOUS | Status: AC
Start: 2022-01-17 — End: 2022-01-17
  Administered 2022-01-17: 1 via TOPICAL
  Filled 2022-01-17: qty 0.9

## 2022-01-17 NOTE — ED Notes (Signed)
Selena Conley, NT cleaned pt's toe and wrapped injured toe with dry dressing. Bacitracin placed on injury prior to wound being wrapped.

## 2022-01-17 NOTE — ED Provider Triage Note (Signed)
Emergency Medicine Provider Triage Evaluation Note  Selena Conley , a 56 y.o. female  was evaluated in triage.  Pt complains of toe injury. Pt report she was getting out of bed in the dark and her left little toe got caught against a hard object causing a tear in her skin.  Unsure last tetanus.  Has toe pain  Review of Systems  Positive: As above Negative: As above  Physical Exam  BP 96/85   Pulse 89   Temp 98.8 F (37.1 C) (Oral)   Resp 16   SpO2 99%  Gen:   Awake, no distress   Resp:  Normal effort  MSK:   Moves extremities without difficulty  Other:  Lac noted to webspace between the left 4th-5th toe  Medical Decision Making  Medically screening exam initiated at 8:16 PM.  Appropriate orders placed.  Selena Conley was informed that the remainder of the evaluation will be completed by another provider, this initial triage assessment does not replace that evaluation, and the importance of remaining in the ED until their evaluation is complete.     Domenic Moras, PA-C 01/17/22 2020

## 2022-01-17 NOTE — ED Triage Notes (Signed)
Pt reports left foot pain. Pt reports she was getting out of bed this morning when she cut between her toes.

## 2022-01-17 NOTE — ED Provider Notes (Signed)
[facility] EMERGENCY DEPARTMENT Provider Note   CSN: [id number] Arrival date & time: 01/17/22  2010     History  Chief Complaint  Patient presents with   Foot Injury    Selena Conley is a 56 y.o. female.  Patient c/o injury to left small toe this AM. Was getting out of bed and hit toe on frame/bed. Notes small amount blood to area. Last tetanus unknown. No toe/foot pain prior to injury this AM.   The history is provided by the patient and medical records.  Foot Injury Associated symptoms: no fever       Home Medications Prior to Admission medications   Medication Sig Start Date End Date Taking? Authorizing Provider  alum & mag hydroxide-simeth (MAALOX/MYLANTA) 200-200-20 MG/5 SUSP Apply 1 application topically as needed. Patient not taking: Reported on 08/17/2016 03/27/13   Alvira Monday, MD  traMADol (ULTRAM) 50 MG tablet Take 1 tablet (50 mg total) by mouth every 6 (six) hours as needed. 01/26/18   Dietrich Pates, PA-C      Allergies    Patient has no known allergies.    Review of Systems   Review of Systems  Constitutional:  Negative for fever.  Musculoskeletal:        Toe injury  Skin:  Positive for wound.   Physical Exam Updated Vital Signs BP 96/85   Pulse 89   Temp 98.8 F (37.1 C) (Oral)   Resp 16   SpO2 99%  Physical Exam Vitals and nursing note reviewed.  Constitutional:      Appearance: Normal appearance. She is well-developed.  HENT:     Head: Atraumatic.  Neck:     Trachea: No tracheal deviation.  Cardiovascular:     Rate and Rhythm: Normal rate.     Pulses: Normal pulses.     Heart sounds:    No friction rub.  Pulmonary:     Effort: Pulmonary effort is normal. No respiratory distress.  Genitourinary:    Comments: No cva tenderness.  Musculoskeletal:     Cervical back: No muscular tenderness.     Comments: 2-3 mm superficial lac in space between left 4th and 5th toe, tenderness to left small toe.  No bleeding. No fb  seen or felt. Normal cap refills in toes. No sign of infection. No other focal bony tenderness on foot exam.   Skin:    General: Skin is warm and dry.     Findings: No rash.  Neurological:     Mental Status: She is alert.     Comments: Alert, speech normal.   Psychiatric:        Mood and Affect: Mood normal.    ED Results / Procedures / Treatments   Labs (all labs ordered are listed, but only abnormal results are displayed) Labs Reviewed - No data to display  EKG None  Radiology DG Foot Complete Left  Result Date: 01/17/2022 CLINICAL DATA:  Toe injury. EXAM: LEFT FOOT - COMPLETE 3+ VIEW COMPARISON:  None Available. FINDINGS: There is no evidence of fracture or dislocation. There are mild degenerative changes of the first metatarsophalangeal joint. Joint spaces are otherwise well maintained. Soft tissues are unremarkable. IMPRESSION: No acute abnormality. Electronically Signed   By: Darliss Cheney M.D.   On: 01/17/2022 20:42    Procedures Procedures    Medications Ordered in ED Medications  bacitracin ointment (has no administration in time range)  acetaminophen (TYLENOL) tablet 650 mg (has no administration in time range)  Tdap (  BOOSTRIX) injection 0.5 mL (0.5 mLs Intramuscular Given 01/17/22 2022)    ED Course/ Medical Decision Making/ A&P                           Medical Decision Making Problems Addressed: Contusion of left foot, initial encounter: acute illness or injury Laceration of skin of toe, initial encounter: acute illness or injury Toe pain, left: acute illness or injury  Amount and/or Complexity of Data Reviewed External Data Reviewed: notes. Radiology: ordered and independent interpretation performed. Decision-making details documented in ED Course.  Risk OTC drugs. Prescription drug management.  Wound cleaned. Bacitracin and sterile dressing.   Tetanus IM.    Acetaminophen po.  Reviewed nursing notes and prior charts for additional history.    Xrays reviewed/interpreted by me - no fx.  Pt appears stable for d/c.         Final Clinical Impression(s) / ED Diagnoses Final diagnoses:  None    Rx / DC Orders ED Discharge Orders     None         Cathren Laine, MD 01/17/22 2134

## 2022-01-17 NOTE — ED Notes (Signed)
E-signature pad unavailable at time of pt discharge. This RN discussed discharge materials with pt and answered all pt questions. Pt stated understanding of discharge material. ? ?

## 2022-01-17 NOTE — Discharge Instructions (Signed)
It was our pleasure to provide your ER care today - we hope that you feel better.  Keep area very clean/dry. Wash with soap and water 2x/day.  Take acetaminophen or ibuprofen as need.   Return to ER if worse, new symptoms, infection of area, spreading redness, pus, worsening/severe pain, or other concern.

## 2023-10-14 IMAGING — CR DG FOOT COMPLETE 3+V*L*
3 series · 3 of 3 positions shown · non-contrast
Comparison: None Available.

CLINICAL DATA: Toe injury.

EXAM:
LEFT FOOT - COMPLETE 3+ VIEW

[foot ap]
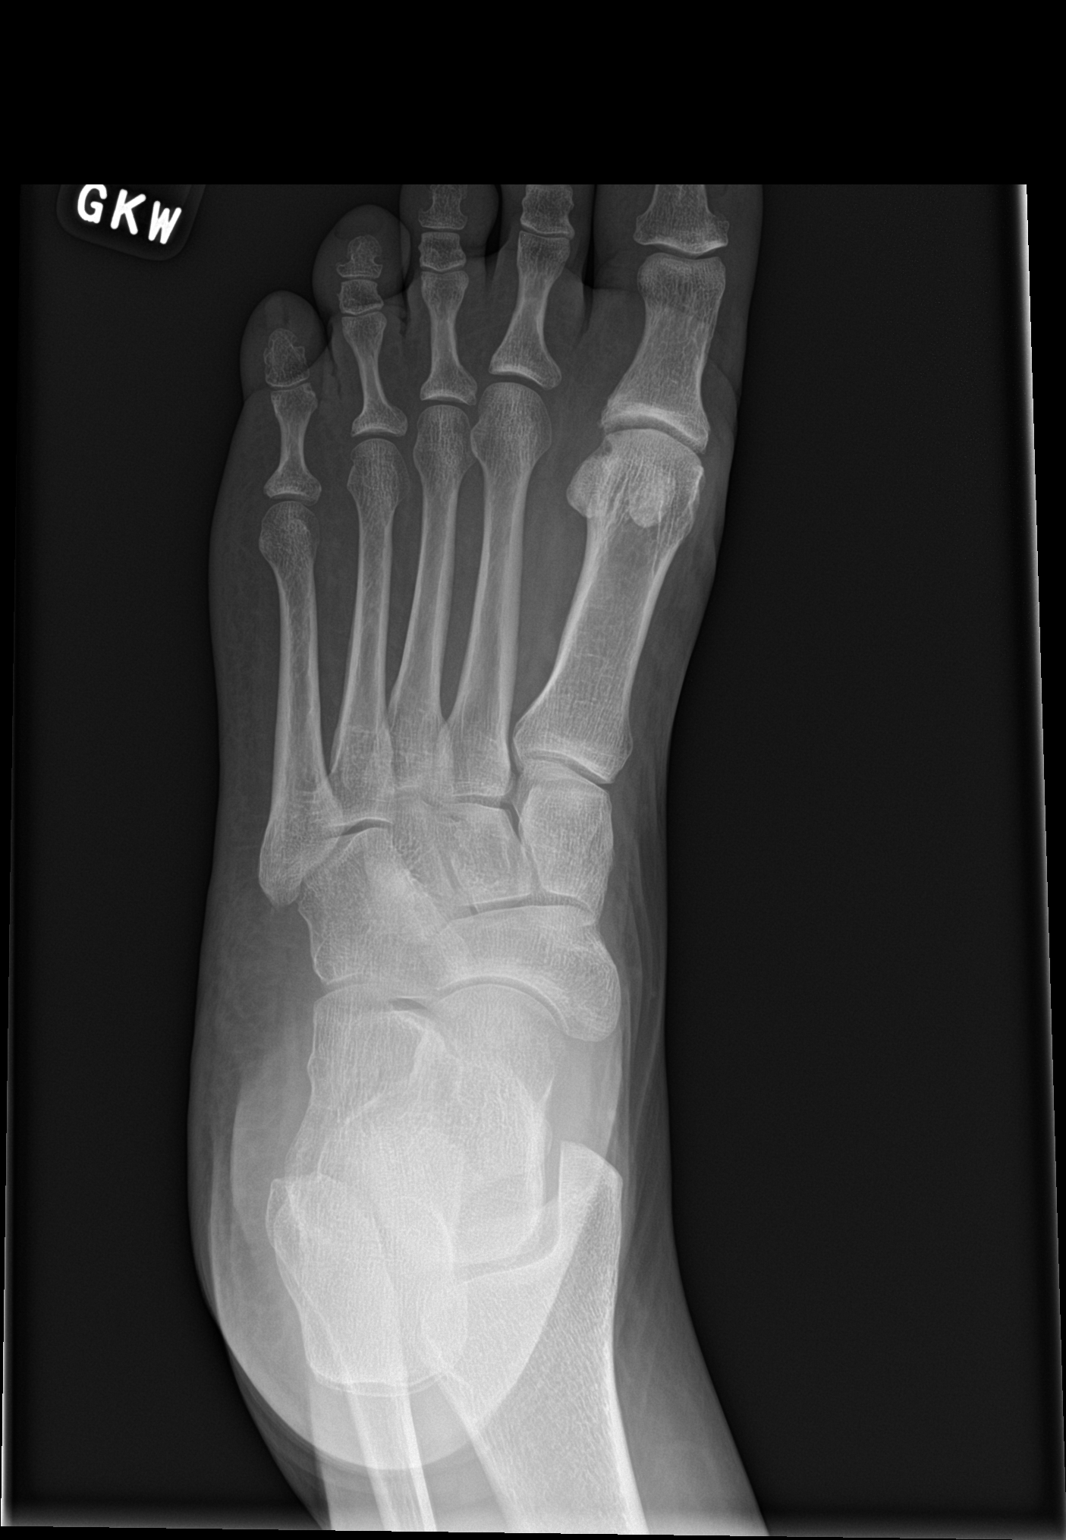

[foot obl]
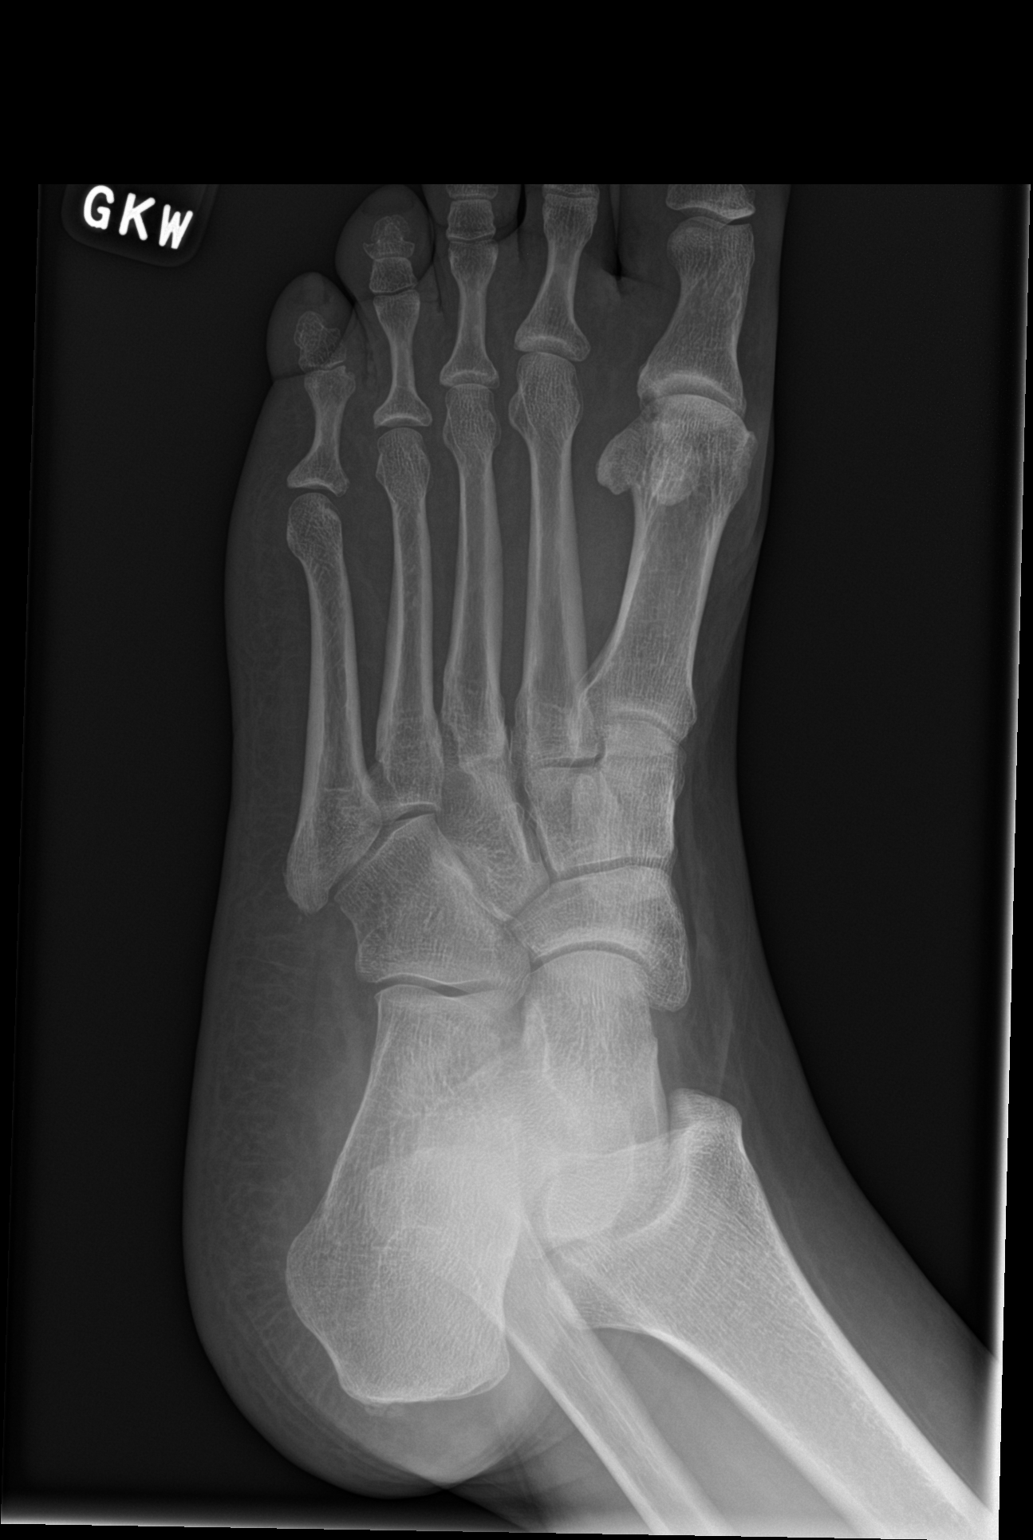

[foot lat]
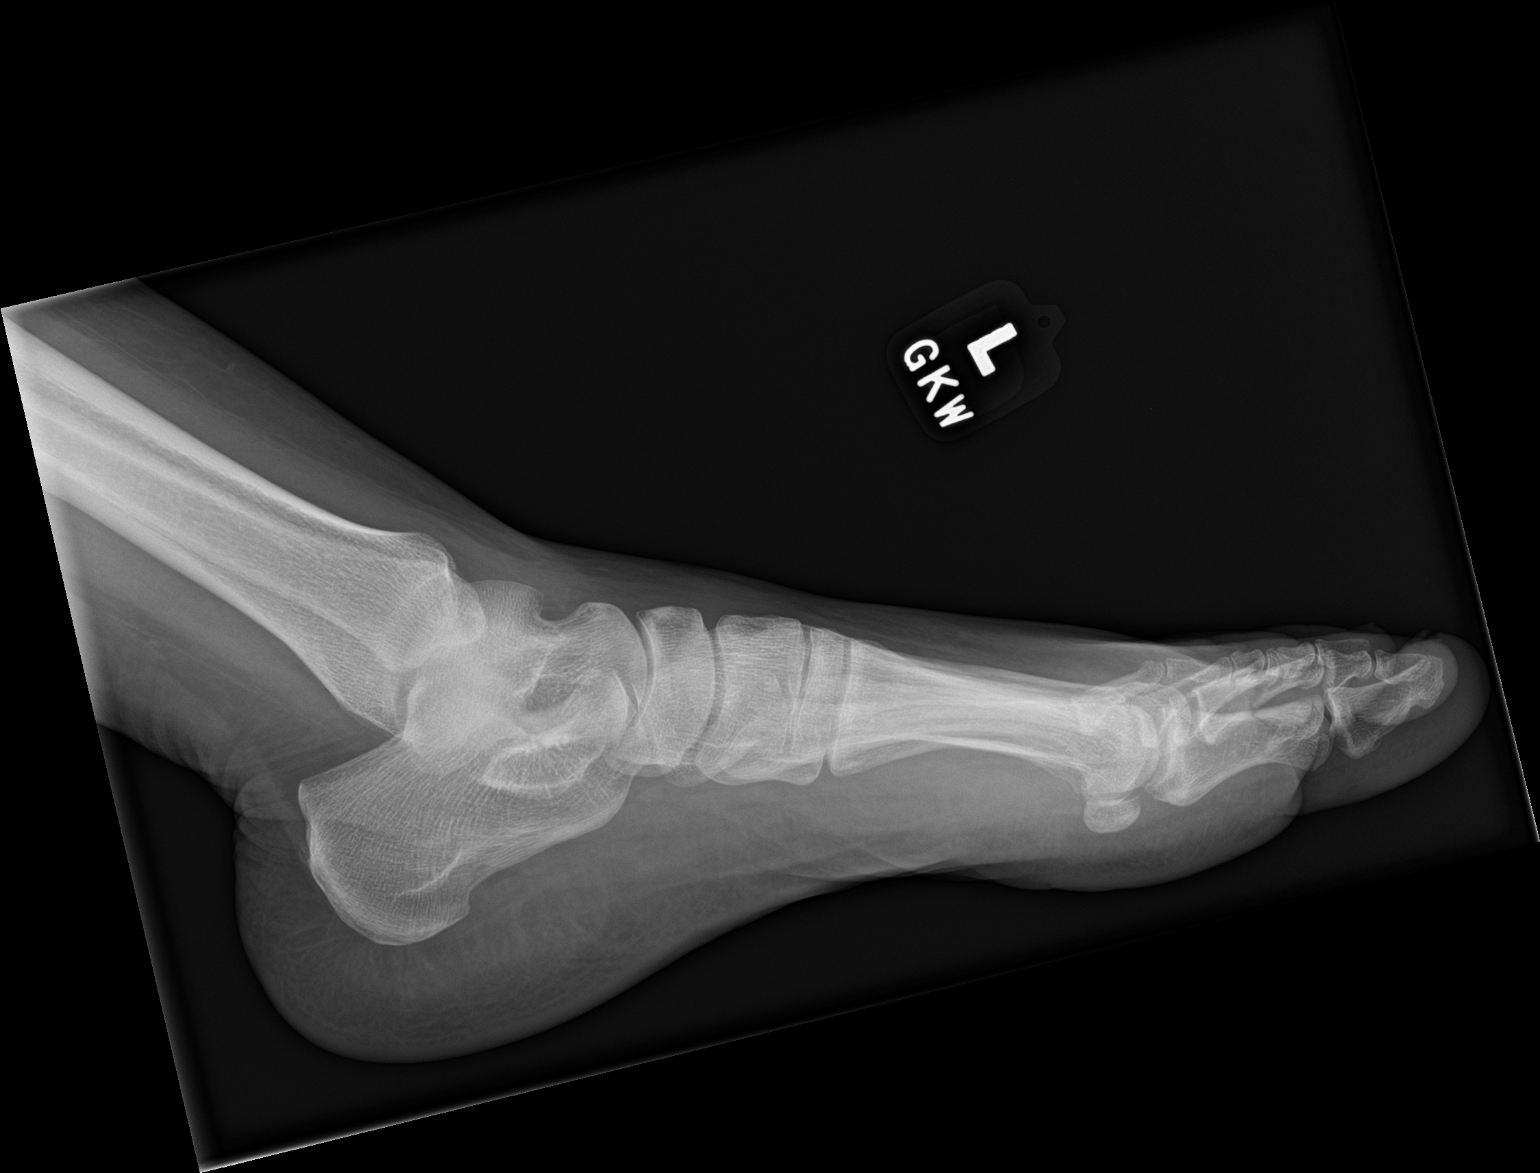

[3 of 3 positions shown; findings below may reference images not displayed]

FINDINGS: There is no evidence of fracture or dislocation. There are mild
degenerative changes of the first metatarsophalangeal joint. Joint
spaces are otherwise well maintained. Soft tissues are unremarkable.
IMPRESSION: No acute abnormality.
